# Patient Record
Sex: Male | Born: 1960 | Race: Black or African American | Hispanic: No | Smoking: Never smoker
Health system: Southern US, Community
[De-identification: ages and names within clinical notes are randomized; demographics above are authoritative.]

---

## 2006-12-09 ENCOUNTER — Emergency Department: Payer: Self-pay | Admitting: Emergency Medicine

## 2008-07-30 ENCOUNTER — Emergency Department: Payer: Self-pay | Admitting: Emergency Medicine

## 2009-06-28 ENCOUNTER — Emergency Department: Payer: Self-pay | Admitting: Emergency Medicine

## 2010-03-04 IMAGING — US ABDOMEN ULTRASOUND
1 series · 17 of 25 positions shown · non-contrast
Comparison: none

REASON FOR EXAM: epigastric pain
COMMENTS:

[Series 1: abdomen ultrasound · 17 of 54 slices shown]
[im 1/54]
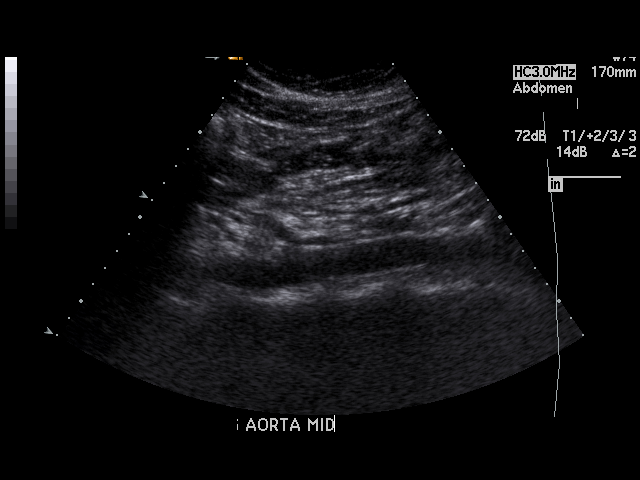
[im 5/54]
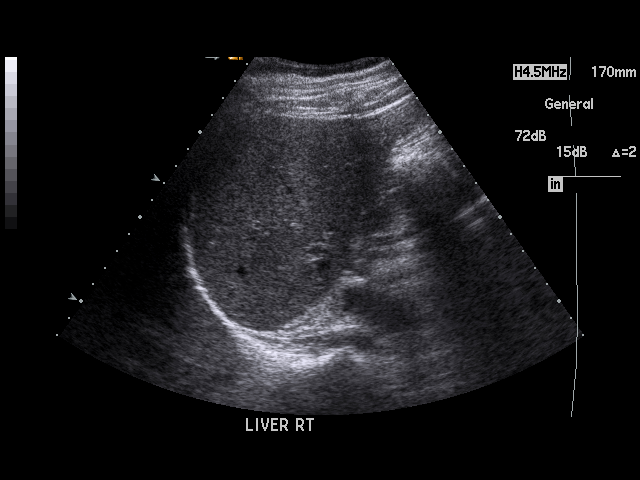
[im 7/54]
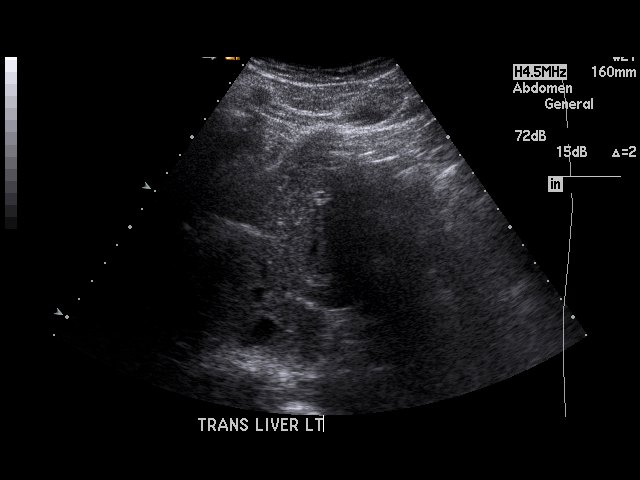
[im 12/54]
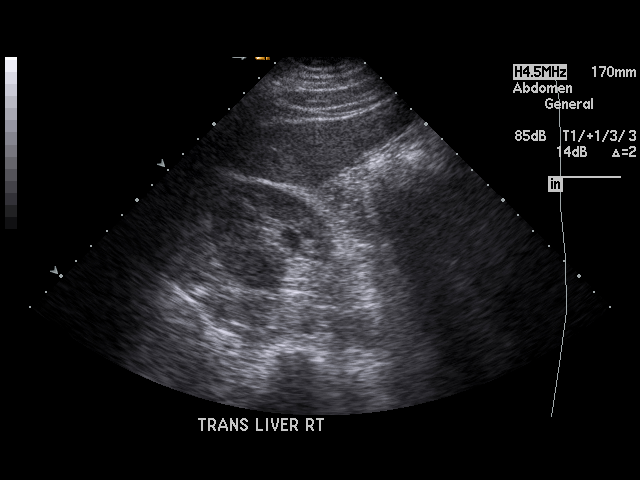
[im 14/54]
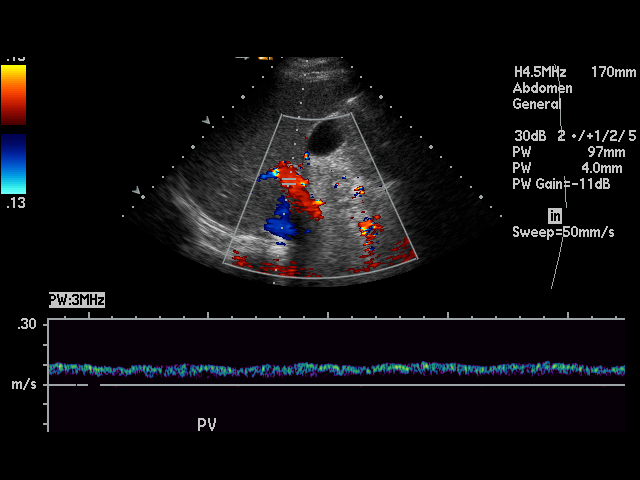
[im 18/54]
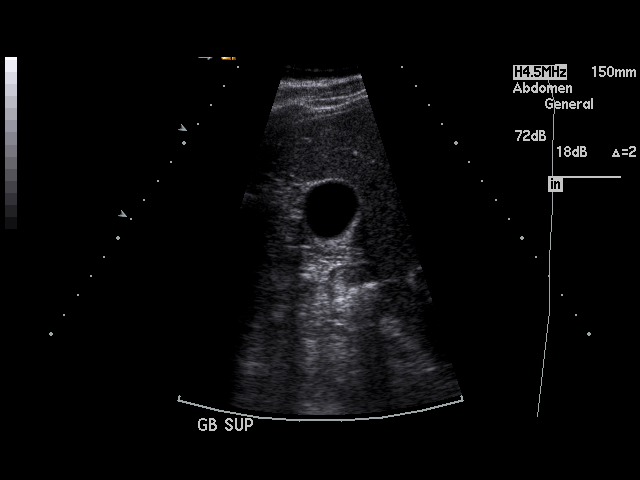
[im 20/54]
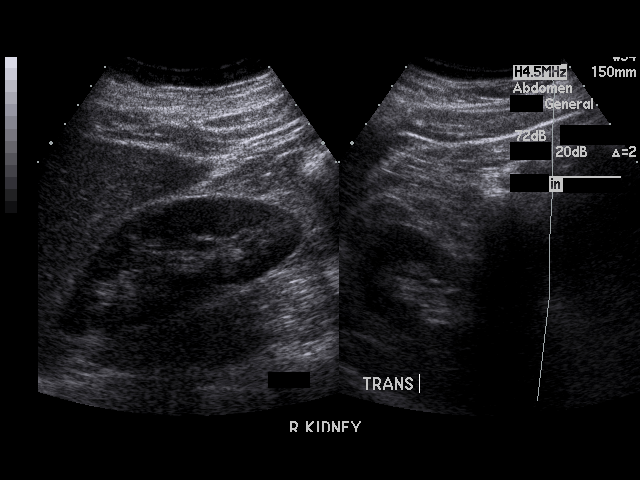
[im 25/54]
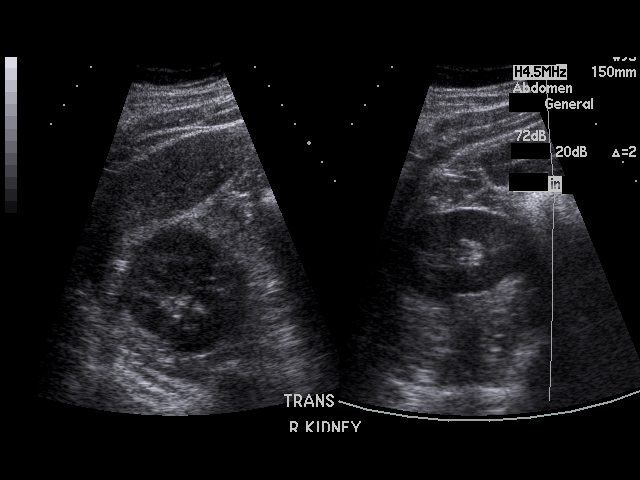
[im 27/54]
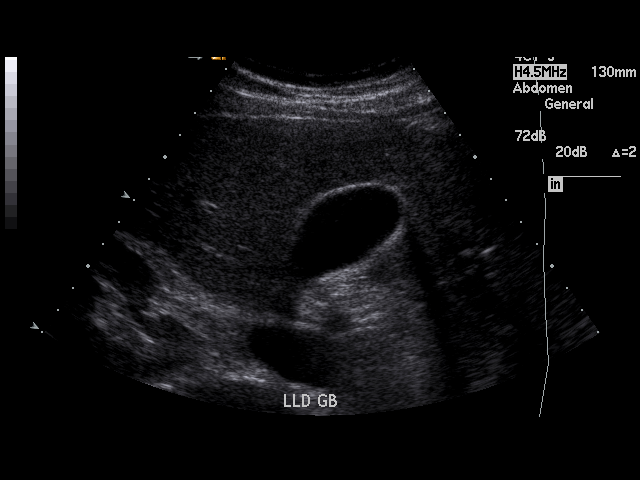
[im 29/54]
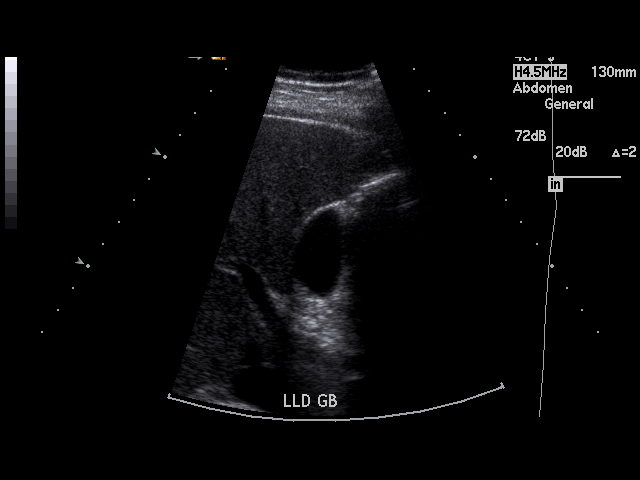
[im 34/54]
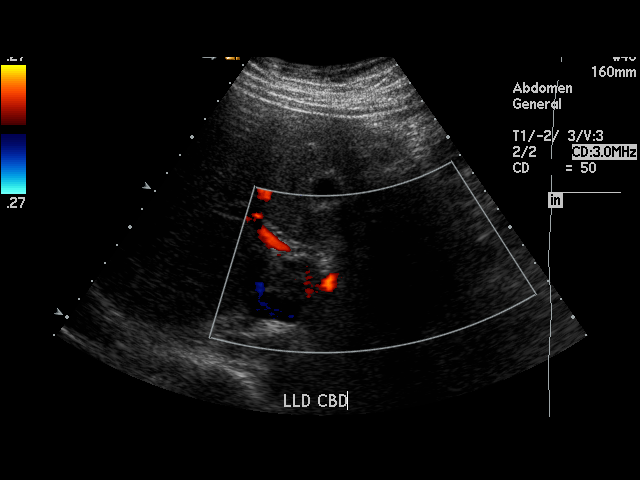
[im 36/54]
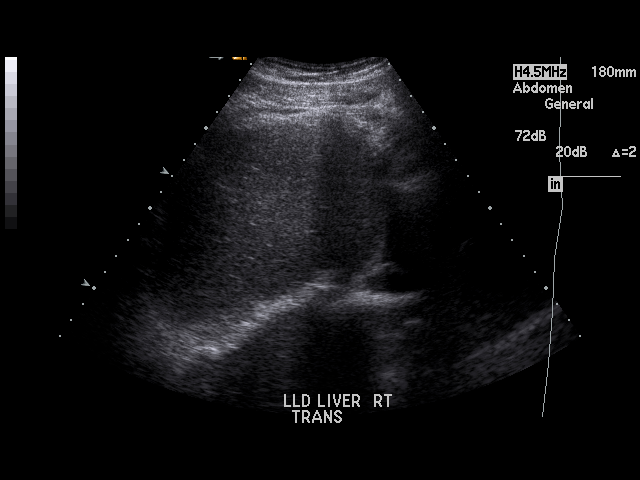
[im 40/54]
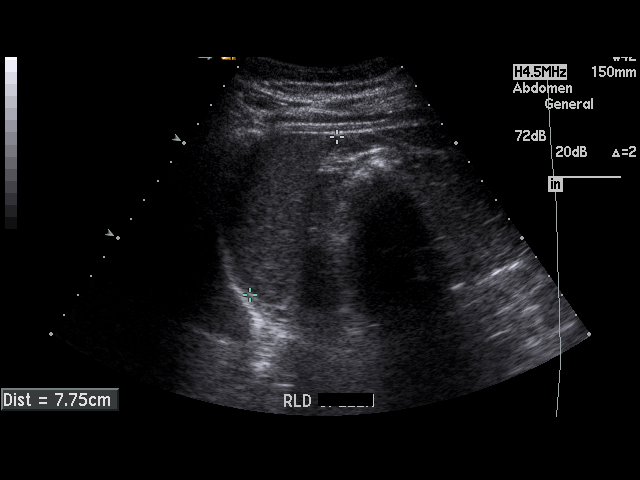
[im 42/54]
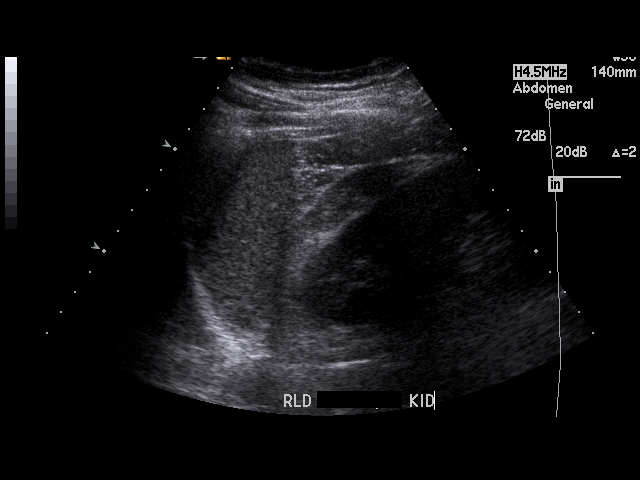
[im 47/54]
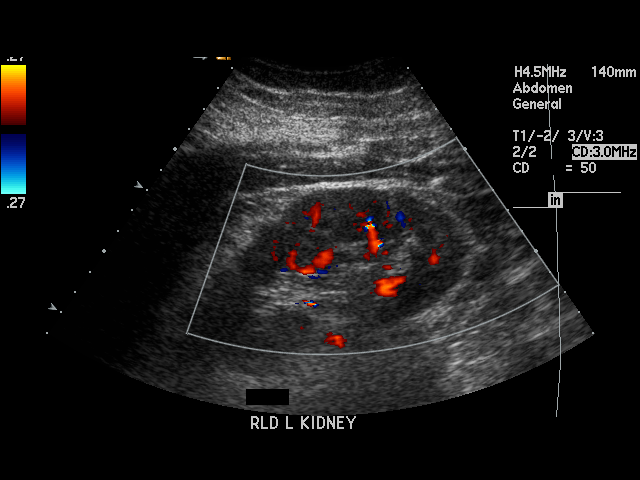
[im 49/54]
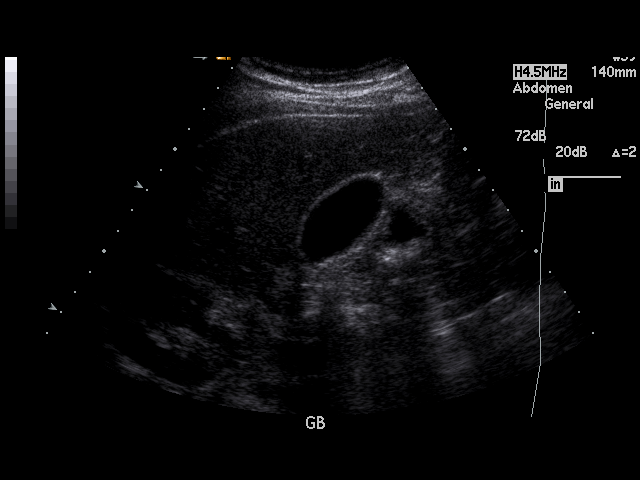
[im 54/54]
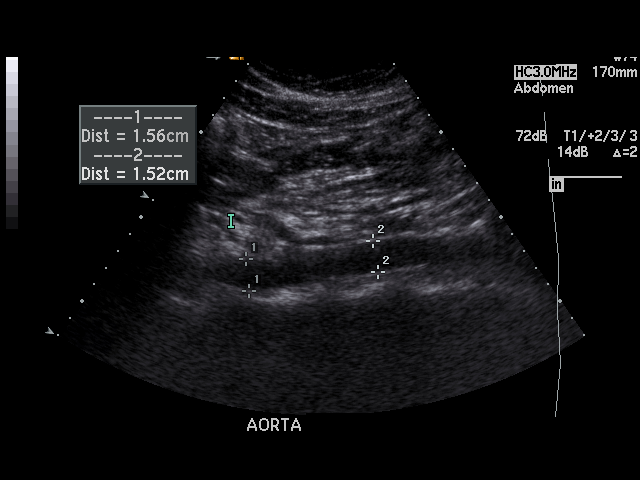

[17 of 25 positions shown; findings below may reference images not displayed]

PROCEDURE:     US  - US ABDOMEN GENERAL SURVEY  - July 30, 2008  [DATE]

RESULT:     The study is somewhat limited due to the patient's body habitus.
The liver exhibits no focal mass or ductal dilation. Portal venous flow is
normal in direction toward the liver. The pancreas could not be adequately
demonstrated due to the presence of bowel gas. The gallbladder is adequately
distended with no evidence of stones, wall thickening or pericholecystic
fluid. The common bile duct measures 1.8 mm in diameter. The spleen,
abdominal aorta and kidneys are normal in appearance. There is no evidence
of ascites.
IMPRESSION: I do not see acute intra-abdominal abnormality on this
study. Evaluation of the pancreas is limited due to bowel gas and the
patient's body habitus.

## 2010-12-05 ENCOUNTER — Emergency Department: Payer: Self-pay

## 2011-03-06 ENCOUNTER — Emergency Department: Payer: Self-pay | Admitting: Emergency Medicine

## 2011-06-19 ENCOUNTER — Emergency Department: Payer: Self-pay | Admitting: Emergency Medicine

## 2011-06-23 ENCOUNTER — Emergency Department: Payer: Self-pay | Admitting: *Deleted

## 2011-06-29 ENCOUNTER — Emergency Department: Payer: Self-pay | Admitting: Emergency Medicine

## 2012-12-22 ENCOUNTER — Emergency Department: Payer: Self-pay | Admitting: Emergency Medicine

## 2013-01-31 IMAGING — CR DG LUMBAR SPINE 2-3V
1 series · 3 of 3 positions shown · non-contrast
Comparison: none

REASON FOR EXAM: pain      Flex 3
COMMENTS:   LMP: (Male)

[Series 1: ap · 0.17mm/px · 3 of 3 slices shown]
[im 1/3]
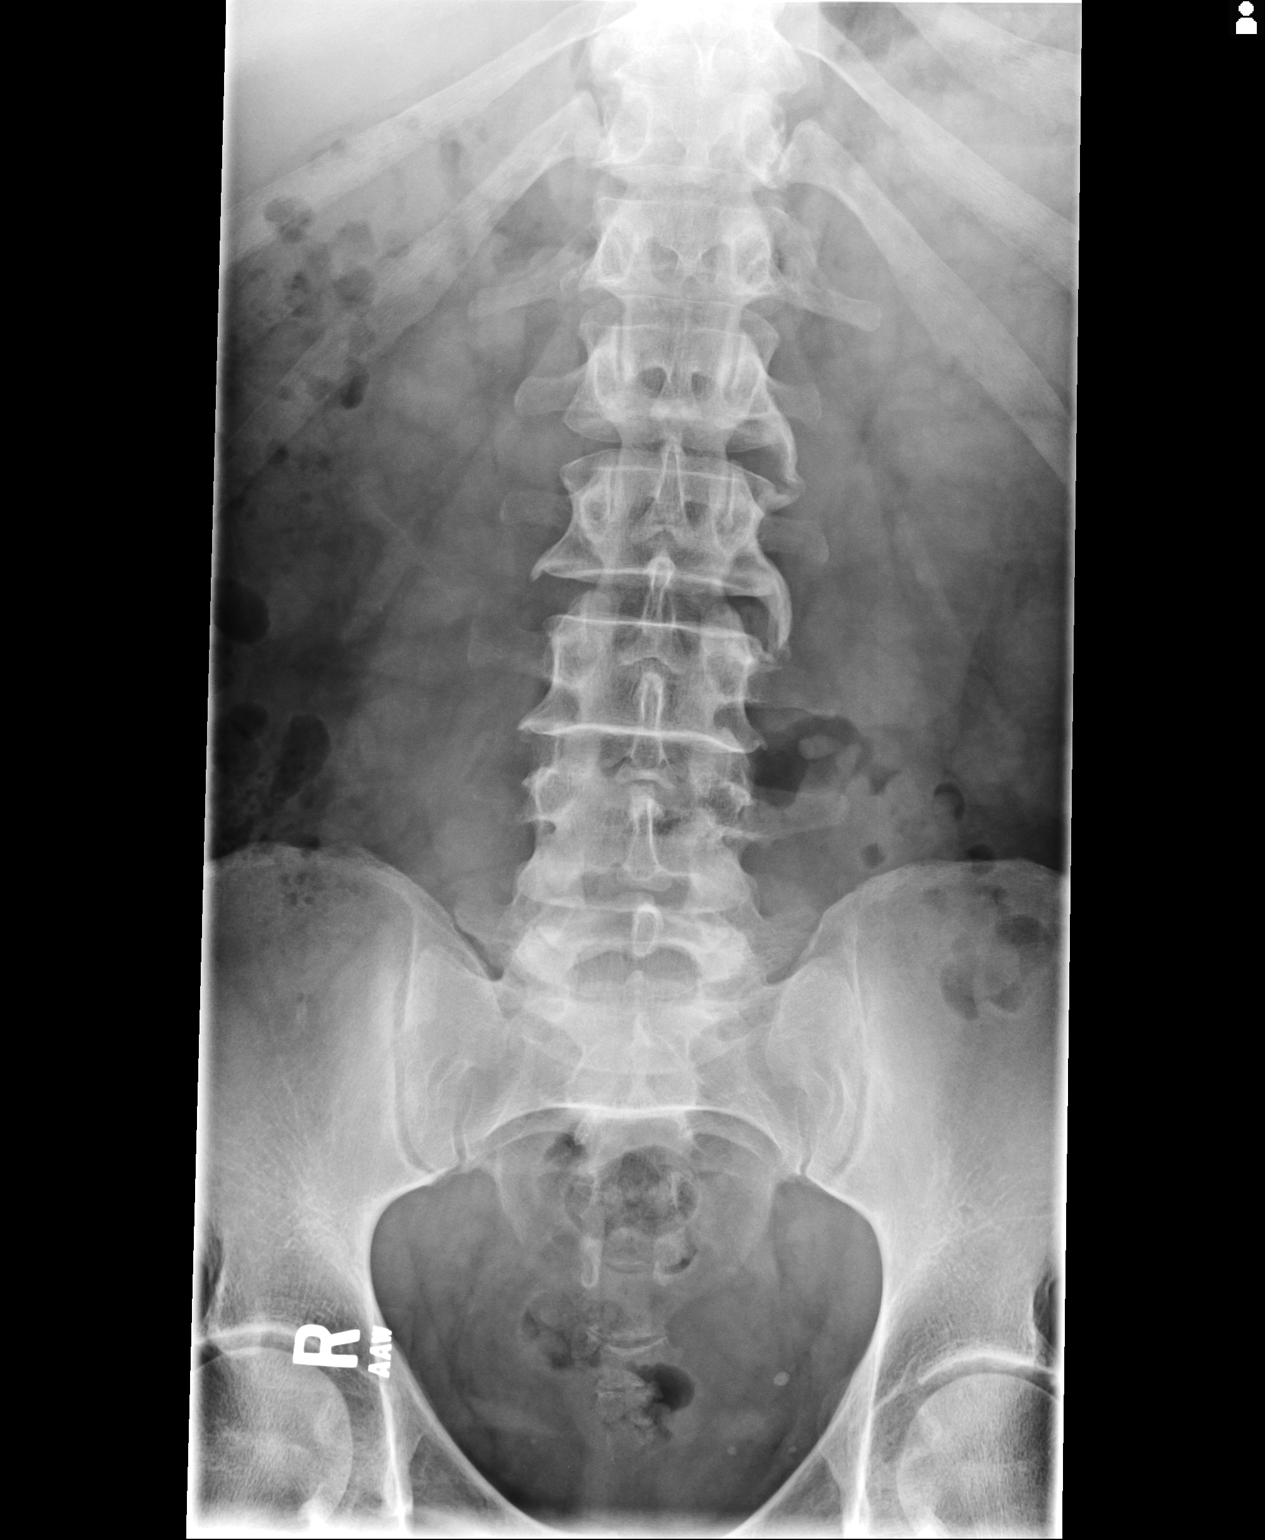
[im 2/3]
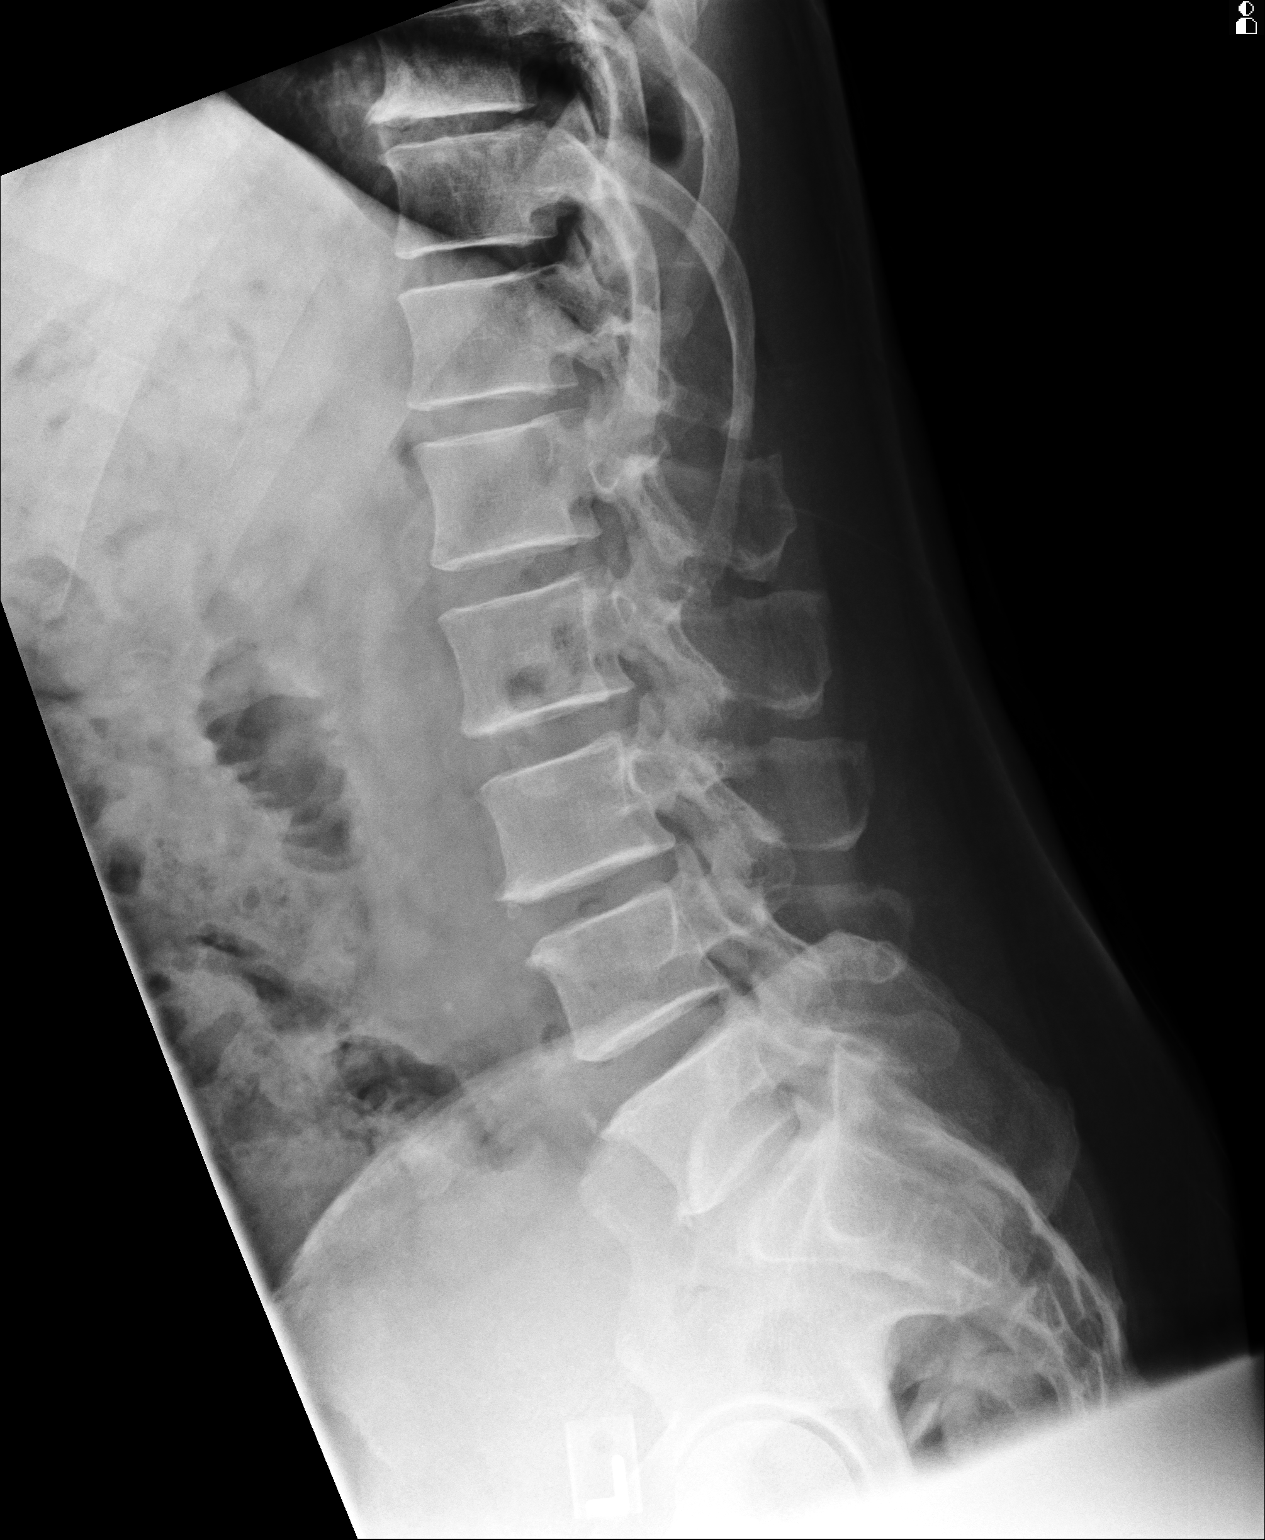
[im 3/3]
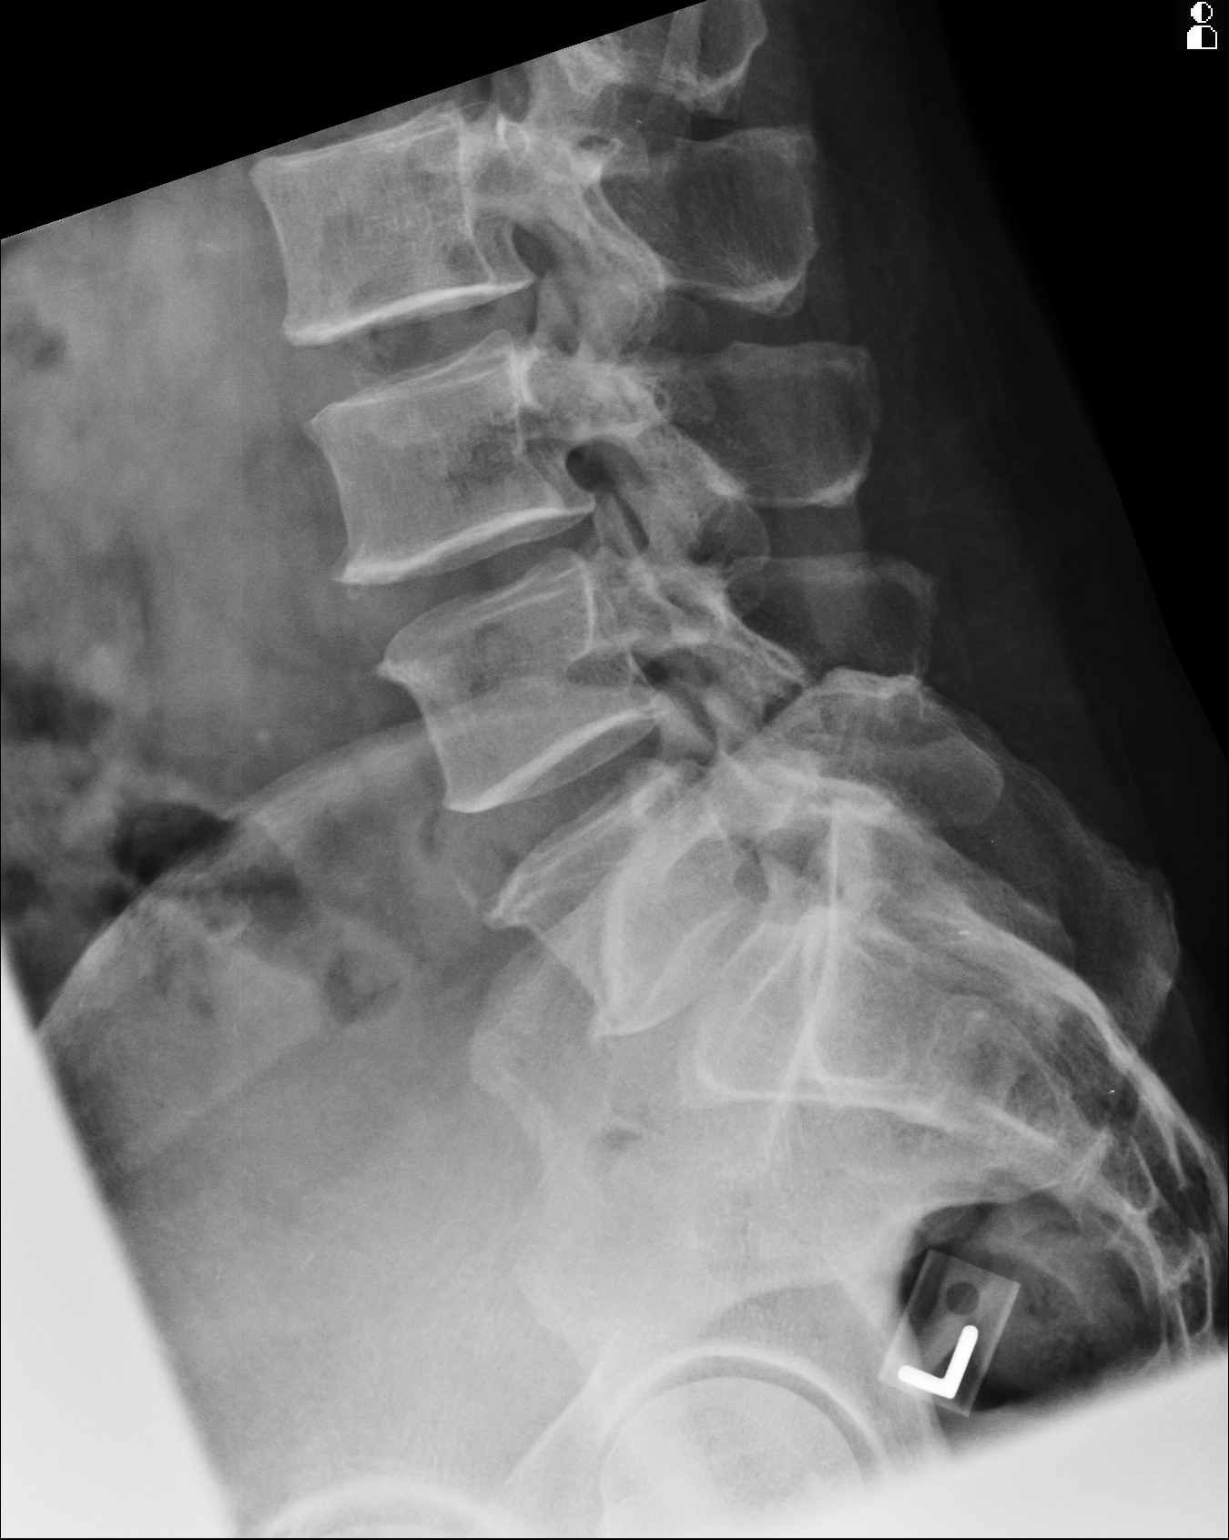

[3 of 3 positions shown; findings below may reference images not displayed]

PROCEDURE:     DXR - DXR LUMBAR SPINE AP AND LATERAL  - June 29, 2011  [DATE]

RESULT:     The lumbar vertebral bodies are preserved in height. The
intervertebral disc space heights are well maintained. The pedicles and
transverse processes are intact. There are lateral osteophytes bridging the
L2-L3 and L3-L4 disc is. The observed portions of the sacrum are normal.
IMPRESSION: I do not see evidence of an acute lumbar spine fracture nor
dislocation. There are degenerative changes present.

## 2013-05-21 ENCOUNTER — Emergency Department: Payer: Self-pay | Admitting: Emergency Medicine

## 2013-07-07 ENCOUNTER — Emergency Department: Payer: Self-pay | Admitting: Emergency Medicine

## 2013-07-07 LAB — DRUG SCREEN, URINE
AMPHETAMINES, UR SCREEN: NEGATIVE (ref ?–1000)
BARBITURATES, UR SCREEN: NEGATIVE (ref ?–200)
BENZODIAZEPINE, UR SCRN: NEGATIVE (ref ?–200)
CANNABINOID 50 NG, UR ~~LOC~~: NEGATIVE (ref ?–50)
Cocaine Metabolite,Ur ~~LOC~~: POSITIVE (ref ?–300)
MDMA (ECSTASY) UR SCREEN: NEGATIVE (ref ?–500)
Methadone, Ur Screen: NEGATIVE (ref ?–300)
OPIATE, UR SCREEN: NEGATIVE (ref ?–300)
Phencyclidine (PCP) Ur S: NEGATIVE (ref ?–25)
Tricyclic, Ur Screen: NEGATIVE (ref ?–1000)

## 2013-07-07 LAB — URINALYSIS, COMPLETE
BILIRUBIN, UR: NEGATIVE
Bacteria: NONE SEEN
Blood: NEGATIVE
GLUCOSE, UR: NEGATIVE mg/dL (ref 0–75)
Ketone: NEGATIVE
Leukocyte Esterase: NEGATIVE
Nitrite: NEGATIVE
PH: 7 (ref 4.5–8.0)
PROTEIN: NEGATIVE
RBC,UR: 1 /HPF (ref 0–5)
SPECIFIC GRAVITY: 1.01 (ref 1.003–1.030)
Squamous Epithelial: <1

## 2013-07-07 LAB — CBC
HCT: 41.1 % (ref 40.0–52.0)
HGB: 13.6 g/dL (ref 13.0–18.0)
MCH: 29.4 pg (ref 26.0–34.0)
MCHC: 33.1 g/dL (ref 32.0–36.0)
MCV: 89 fL (ref 80–100)
PLATELETS: 206 10*3/uL (ref 150–440)
RBC: 4.63 10*6/uL (ref 4.40–5.90)
RDW: 13.9 % (ref 11.5–14.5)
WBC: 7.4 10*3/uL (ref 3.8–10.6)

## 2013-07-07 LAB — COMPREHENSIVE METABOLIC PANEL
ALT: 34 U/L (ref 12–78)
Albumin: 4 g/dL (ref 3.4–5.0)
Alkaline Phosphatase: 75 U/L
Anion Gap: 4 — ABNORMAL LOW (ref 7–16)
BUN: 16 mg/dL (ref 7–18)
Bilirubin,Total: 0.3 mg/dL (ref 0.2–1.0)
CALCIUM: 9 mg/dL (ref 8.5–10.1)
CHLORIDE: 107 mmol/L (ref 98–107)
CO2: 30 mmol/L (ref 21–32)
Creatinine: 1.39 mg/dL — ABNORMAL HIGH (ref 0.60–1.30)
EGFR (Non-African Amer.): 57 — ABNORMAL LOW
GLUCOSE: 86 mg/dL (ref 65–99)
Osmolality: 282 (ref 275–301)
Potassium: 3.6 mmol/L (ref 3.5–5.1)
SGOT(AST): 29 U/L (ref 15–37)
SODIUM: 141 mmol/L (ref 136–145)
Total Protein: 7.4 g/dL (ref 6.4–8.2)

## 2013-07-07 LAB — ETHANOL: Ethanol %: <0.003 % (ref 0.000–0.080)

## 2013-07-07 LAB — SALICYLATE LEVEL: Salicylates, Serum: <1.7 mg/dL

## 2013-07-07 LAB — ACETAMINOPHEN LEVEL: Acetaminophen: <2 ug/mL

## 2013-12-03 ENCOUNTER — Emergency Department: Payer: Self-pay | Admitting: Emergency Medicine

## 2013-12-03 LAB — URINALYSIS, COMPLETE
BACTERIA: NONE SEEN
Bilirubin,UR: NEGATIVE
Blood: NEGATIVE
Glucose,UR: NEGATIVE mg/dL (ref 0–75)
KETONE: NEGATIVE
LEUKOCYTE ESTERASE: NEGATIVE
Nitrite: NEGATIVE
PROTEIN: NEGATIVE
Ph: 5 (ref 4.5–8.0)
RBC,UR: 1 /HPF (ref 0–5)
Specific Gravity: 1.012 (ref 1.003–1.030)
Squamous Epithelial: <1

## 2013-12-03 LAB — COMPREHENSIVE METABOLIC PANEL
ALT: 43 U/L (ref 12–78)
Albumin: 3.9 g/dL (ref 3.4–5.0)
Alkaline Phosphatase: 87 U/L
Anion Gap: 2 — ABNORMAL LOW (ref 7–16)
BUN: 11 mg/dL (ref 7–18)
Bilirubin,Total: 0.6 mg/dL (ref 0.2–1.0)
CALCIUM: 8.8 mg/dL (ref 8.5–10.1)
CHLORIDE: 111 mmol/L — AB (ref 98–107)
CREATININE: 1.35 mg/dL — AB (ref 0.60–1.30)
Co2: 30 mmol/L (ref 21–32)
EGFR (Non-African Amer.): 60 — ABNORMAL LOW
GLUCOSE: 87 mg/dL (ref 65–99)
Osmolality: 284 (ref 275–301)
Potassium: 3.5 mmol/L (ref 3.5–5.1)
SGOT(AST): 38 U/L — ABNORMAL HIGH (ref 15–37)
SODIUM: 143 mmol/L (ref 136–145)
Total Protein: 7.4 g/dL (ref 6.4–8.2)

## 2013-12-03 LAB — CBC
HCT: 40.3 % (ref 40.0–52.0)
HGB: 13.4 g/dL (ref 13.0–18.0)
MCH: 29.8 pg (ref 26.0–34.0)
MCHC: 33.2 g/dL (ref 32.0–36.0)
MCV: 90 fL (ref 80–100)
Platelet: 172 10*3/uL (ref 150–440)
RBC: 4.5 10*6/uL (ref 4.40–5.90)
RDW: 13.4 % (ref 11.5–14.5)
WBC: 8.7 10*3/uL (ref 3.8–10.6)

## 2013-12-03 LAB — SALICYLATE LEVEL: Salicylates, Serum: <1.7 mg/dL

## 2013-12-03 LAB — DRUG SCREEN, URINE
Amphetamines, Ur Screen: NEGATIVE
Barbiturates, Ur Screen: NEGATIVE
Benzodiazepine, Ur Scrn: NEGATIVE
Cannabinoid 50 Ng, Ur ~~LOC~~: NEGATIVE
Cocaine Metabolite,Ur ~~LOC~~: POSITIVE
MDMA (Ecstasy)Ur Screen: NEGATIVE
Methadone, Ur Screen: NEGATIVE
Opiate, Ur Screen: NEGATIVE
Phencyclidine (PCP) Ur S: NEGATIVE
Tricyclic, Ur Screen: NEGATIVE

## 2013-12-03 LAB — ETHANOL

## 2013-12-03 LAB — ACETAMINOPHEN LEVEL: Acetaminophen: <2 ug/mL

## 2014-10-24 NOTE — Consult Note (Signed)
Brief Consult Note: Diagnosis: Cocaine Dependence.   Patient was seen by consultant.   Recommend further assessment or treatment.   Comments: Pt seen in ED BHU. He stated that he has been using Cocaine seen he came out of prison 3 years ago and has been steadily using it on a daily basis. He stated that he is willing to go to [facility] as he has never been to [facility] in the past. He currently denied mood, anxiety symptoms at this time. No w/d symptoms noted. He denied SI/HI or plans.   Plan; Pt is referred to [facility] and will have a bed available soon.  He is being monitored by the staff.  Electronic Signatures: Rhunette CroftFaheem, Farrel Guimond S (MD)  (Signed 06-Jan-15 10:23)  Authored: Brief Consult Note   Last Updated: 06-Jan-15 10:23 by Rhunette CroftFaheem, Gerry Heaphy S (MD)

## 2014-10-24 NOTE — Consult Note (Signed)
PATIENT NAME:  Adam LenisFARRINGTON, Adam L MR#:  [id number] DATE OF BIRTH:  03/03/1961  DATE OF CONSULTATION:  07/07/2013  REFERRING PHYSICIAN:   CONSULTING PHYSICIAN:  Delvon Chipps K. Gerald Honea, MD  SUBJECTIVE: The patient was seen in consultation in the Emergency Room. 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 The patient is separated after being married for several years. The patient has been living with his mother who is 55 years old since he returned from prison and currently cannot go back to live with her.   PAST HISTORY: The patient reports that he has been in prison for 9 years for assault and rape charges and he is currently registered as a sex offender. The patient has been living with his mother, age 55 years old, and was abusing crack prior to going to prison and then he came back from prison in 2008 and has been sober for 5 years and started abusing and using crack since he got back with his same old friends. Has been smoking crack on an every day basis and did not show up to work and lost his job. Currently he reports he cannot go back to his mother and he feels that he lost everything he had worked for and started having suicidal thoughts.   ALCOHOL AND DRUGS: Does admit smoking THC occasionally but not on every day basis. Denies opiate use.   MENTAL STATUS EXAMINATION: The patient is seen dressed in hospital scrubs, alert and oriented. Affect is flat. Mood is depressed. Admits to feeling hopeless and helpless and worthless and useless because he lost everything that he worked for and cannot return to go back to live with his mother. No psychosis. Does not appear to be responding to internal stimuli. Memory is intact. Cognition is intact. Did have suicidal wishes and thoughts when he came here, but he feels hopeful that he can be helped  and so he feels better. He does want to get off of crack and wants to get all the help that it takes for him to do that. Insight and judgment guarded.  IMPRESSION:  1.  Crack abuse by smoking it.  2.  THC abuse, dependence. 3.  Substance induced mood disorder. 4.  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   RECOMMENDATIONS: The patient is being evaluated for appropriate placement at a substance abuse program and [facility] will be considered and if necessary he will be considered to go to [facility] substance abuse program as he is eager to get help for the same.  ____________________________ Jannet MantisSurya K. Guss Bunde[place]alla, MD skc:sb D: 07/07/2013 14:55:14 ET T: 07/07/2013 15:20:30 ET JOB#: [id number]  cc: Monika SalkSurya K. Guss [place]unde[place], MD, <Dictator> Beau FannySURYA K Keola Heninger MD ELECTRONICALLY SIGNED 07/14/2013 7:59

## 2015-08-16 ENCOUNTER — Encounter: Payer: Self-pay | Admitting: *Deleted

## 2015-08-16 ENCOUNTER — Emergency Department
Admission: EM | Admit: 2015-08-16 | Discharge: 2015-08-16 | Disposition: A | Discharge status: Home / Self Care | Payer: Self-pay | Attending: Emergency Medicine | Admitting: Emergency Medicine

## 2015-08-16 DIAGNOSIS — R000 Tachycardia, unspecified: Secondary | ICD-10-CM | POA: Insufficient documentation

## 2015-08-16 LAB — COMPREHENSIVE METABOLIC PANEL
ALK PHOS: 74 U/L (ref 38–126)
ALT: 34 U/L (ref 17–63)
ANION GAP: 4 — AB (ref 5–15)
AST: 27 U/L (ref 15–41)
Albumin: 4.1 g/dL (ref 3.5–5.0)
BILIRUBIN TOTAL: 0.8 mg/dL (ref 0.3–1.2)
BUN: 13 mg/dL (ref 6–20)
CALCIUM: 9 mg/dL (ref 8.9–10.3)
CO2: 28 mmol/L (ref 22–32)
CREATININE: 1.14 mg/dL (ref 0.61–1.24)
Chloride: 107 mmol/L (ref 101–111)
Glucose, Bld: 159 mg/dL — ABNORMAL HIGH (ref 65–99)
Potassium: 3.5 mmol/L (ref 3.5–5.1)
SODIUM: 139 mmol/L (ref 135–145)
TOTAL PROTEIN: 7.2 g/dL (ref 6.5–8.1)

## 2015-08-16 LAB — CBC
HEMATOCRIT: 42.1 % (ref 40.0–52.0)
HEMOGLOBIN: 14 g/dL (ref 13.0–18.0)
MCH: 29 pg (ref 26.0–34.0)
MCHC: 33.2 g/dL (ref 32.0–36.0)
MCV: 87.2 fL (ref 80.0–100.0)
Platelets: 185 10*3/uL (ref 150–440)
RBC: 4.82 MIL/uL (ref 4.40–5.90)
RDW: 14.7 % — ABNORMAL HIGH (ref 11.5–14.5)
WBC: 7.6 10*3/uL (ref 3.8–10.6)

## 2015-08-16 LAB — TROPONIN I: Troponin I: <0.03 ng/mL (ref <0.031)

## 2015-08-16 LAB — CK: Total CK: 146 U/L (ref 49–397)

## 2015-08-16 MED ORDER — SODIUM CHLORIDE 0.9 % IV BOLUS (SEPSIS)
1000.0000 mL | Freq: Once | INTRAVENOUS | Status: AC
  Administered 2015-08-16: 1000 mL via INTRAVENOUS

## 2015-08-16 NOTE — ED Notes (Signed)
Pt is trying to donate plasma and unable to until he he seen by a physician for tachycardia (120), pt is here to get cleared for this, pt denies any other problems or concerns

## 2015-08-16 NOTE — ED Provider Notes (Signed)
[facility] Emergency Department Provider Note  ____________________________________________  Time seen: 1:40 PM  I have reviewed the triage vital signs and the nursing notes.   HISTORY  Chief Complaint Tachycardia     HPI Adam Owens. is a 55 y.o. male presents with tachycardia 120. Patient states he went to donate plasma and was referred to the emergency department to be medically cleared secondary to his tachycardia before he could donate. Patient denies any chest pain no shortness of breath no dizziness or any accompanying symptoms with a tachycardia. Of note the patient does admit to using cocaine last use last Friday.     Past medical history Cocaine abuse There are no active problems to display for this patient.   Past surgical history No pertinent past surgical history No current outpatient prescriptions on file.  Allergies No known drug allergies No family history on file.  Social History Social History  Substance Use Topics  . Smoking status: Never Smoker   . Smokeless tobacco: None  . Alcohol Use: No    Review of Systems  Constitutional: Negative for fever. Eyes: Negative for visual changes. ENT: Negative for sore throat. Cardiovascular: Negative for chest pain. Positive for tachycardia Respiratory: Negative for shortness of breath. Gastrointestinal: Negative for abdominal pain, vomiting and diarrhea. Genitourinary: Negative for dysuria. Musculoskeletal: Negative for back pain. Skin: Negative for rash. Neurological: Negative for headaches, focal weakness or numbness.   10-point ROS otherwise negative.  ____________________________________________   PHYSICAL EXAM:  VITAL SIGNS: ED Triage Vitals  Enc Vitals Group     BP 08/16/15 1327 146/95 mmHg     Pulse Rate 08/16/15 1327 120     Resp 08/16/15 1327 18     Temp 08/16/15 1327 98.2 F (36.8 C)     Temp Source 08/16/15 1327 Oral     SpO2 08/16/15 1327 97  %     Weight 08/16/15 1327 219 lb (99.338 kg)     Height 08/16/15 1327  (1.753 m)     Head Cir --      Peak Flow --      Pain Score 08/16/15 1327 0     Pain Loc --      Pain Edu? --      Excl. in GC? --      Constitutional: Alert and oriented. Well appearing and in no distress. Eyes: Conjunctivae are normal. PERRL. Normal extraocular movements. ENT   Head: Normocephalic and atraumatic.   Nose: No congestion/rhinnorhea.   Mouth/Throat: Mucous membranes are moist.   Neck: No stridor. Hematological/Lymphatic/Immunilogical: No cervical lymphadenopathy. Cardiovascular: Normal rate, regular rhythm. Normal and symmetric distal pulses are present in all extremities. No murmurs, rubs, or gallops. Respiratory: Normal respiratory effort without tachypnea nor retractions. Breath sounds are clear and equal bilaterally. No wheezes/rales/rhonchi. Gastrointestinal: Soft and nontender. No distention. There is no CVA tenderness. Genitourinary: deferred Musculoskeletal: Nontender with normal range of motion in all extremities. No joint effusions.  No lower extremity tenderness nor edema. Neurologic:  Normal speech and language. No gross focal neurologic deficits are appreciated. Speech is normal.  Skin:  Skin is warm, dry and intact. No rash noted. Psychiatric: Mood and affect are normal. Speech and behavior are normal. Patient exhibits appropriate insight and judgment.  ____________________________________________    LABS (pertinent positives/negatives)  Labs Reviewed  CBC - Abnormal; Notable for the following:    RDW 14.7 (*)    All other components within normal limits  COMPREHENSIVE METABOLIC PANEL - Abnormal;  Notable for the following:    Glucose, Bld 159 (*)    Anion gap 4 (*)    All other components within normal limits  TROPONIN I  CK     ____________________________________________   EKG  ED ECG REPORT I, BROWN, Mignon N, the attending physician,  personally viewed and interpreted this ECG.   Date: 08/16/2015  EKG Time: 1:37 PM  Rate: 108  Rhythm: Sinus tachycardia  Axis: Normal  Intervals: Normal  ST&T Change: None    INITIAL IMPRESSION / ASSESSMENT AND PLAN / ED COURSE  Pertinent labs & imaging results that were available during my care of the patient were reviewed by me and considered in my medical decision making (see chart for details).  Patient advised to follow-up with cardiology as no clear etiology for the patient's tachycardia identified in the emergency department. Did discuss cessation of cocaine use with the patient.  ____________________________________________   FINAL CLINICAL IMPRESSION(S) / ED DIAGNOSES  Final diagnoses:  Tachycardia      Darci Current, MD 08/18/15 2251

## 2015-08-16 NOTE — ED Notes (Signed)
Pt has no complaints, pt is here to get a physicians note so he can donate plasma , pt's heart rate today while attempting to donate plasma was 120 according to pt, pt denies any concerns

## 2015-08-16 NOTE — ED Notes (Signed)
Patient given bus pass on discharge by Child psychotherapist.  Patient states he could not find a ride home.  Patient verbalizes need to follow-up with cardiologist.

## 2015-08-16 NOTE — Discharge Instructions (Signed)
Nonspecific Tachycardia Tachycardia is a faster than normal heartbeat (more than 100 beats per minute). In adults, the heart normally beats between 60 and 100 times a minute. A fast heartbeat may be a normal response to exercise or stress. It does not necessarily mean that something is wrong. However, sometimes when your heart beats too fast it may not be able to pump enough blood to the rest of your body. This can result in chest pain, shortness of breath, dizziness, and even fainting. Nonspecific tachycardia means that the specific cause or pattern of your tachycardia is unknown. CAUSES  Tachycardia may be harmless or it may be due to a more serious underlying cause. Possible causes of tachycardia include:  Exercise or exertion.  Fever.  Pain or injury.  Infection.  Loss of body fluids (dehydration).  Overactive thyroid.  Lack of red blood cells (anemia).  Anxiety and stress.  Alcohol.  Caffeine.  Tobacco products.  Diet pills.  Illegal drugs.  Heart disease. SYMPTOMS  Rapid or irregular heartbeat (palpitations).  Suddenly feeling your heart beating (cardiac awareness).  Dizziness.  Tiredness (fatigue).  Shortness of breath.  Chest pain.  Nausea.  Fainting. DIAGNOSIS  Your caregiver will perform a physical exam and take your medical history. In some cases, a heart specialist (cardiologist) may be consulted. Your caregiver may also order:  Blood tests.  Electrocardiography. This test records the electrical activity of your heart.  A heart monitoring test. TREATMENT  Treatment will depend on the likely cause of your tachycardia. The goal is to treat the underlying cause of your tachycardia. Treatment methods may include:  Replacement of fluids or blood through an intravenous (IV) tube for moderate to severe dehydration or anemia.  New medicines or changes in your current medicines.  Diet and lifestyle changes.  Treatment for certain  infections.  Stress relief or relaxation methods. HOME CARE INSTRUCTIONS   Rest.  Drink enough fluids to keep your urine clear or pale yellow.  Do not smoke.  Avoid:  Caffeine.  Tobacco.  Alcohol.  Chocolate.  Stimulants such as over-the-counter diet pills or pills that help you stay awake.  Situations that cause anxiety or stress.  Illegal drugs such as marijuana, phencyclidine (PCP), and cocaine.  Only take medicine as directed by your caregiver.  Keep all follow-up appointments as directed by your caregiver. SEEK IMMEDIATE MEDICAL CARE IF:   You have pain in your chest, upper arms, jaw, or neck.  You become weak, dizzy, or feel faint.  You have palpitations that will not go away.  You vomit, have diarrhea, or pass blood in your stool.  Your skin is cool, pale, and wet.  You have a fever that will not go away with rest, fluids, and medicine. MAKE SURE YOU:   Understand these instructions.  Will watch your condition.  Will get help right away if you are not doing well or get worse.   This information is not intended to replace advice given to you by your health care provider. Make sure you discuss any questions you have with your health care provider.   Document Released: 07/27/2004 Document Revised: 09/11/2011 Document Reviewed: 01/01/2015 Elsevier Interactive Patient Education 2016 [employer]
